# Patient Record
Sex: Male | Born: 1985 | Race: White | Hispanic: No | Marital: Single | Smoking: Current every day smoker
Health system: Southern US, Community
[De-identification: ages and names within clinical notes are randomized; demographics above are authoritative.]

## PROBLEM LIST (undated history)

## (undated) HISTORY — PX: BACK SURGERY: SHX140

---

## 2019-05-30 ENCOUNTER — Encounter (HOSPITAL_BASED_OUTPATIENT_CLINIC_OR_DEPARTMENT_OTHER): Payer: Self-pay | Admitting: *Deleted

## 2019-05-30 ENCOUNTER — Other Ambulatory Visit: Payer: Self-pay

## 2019-05-30 ENCOUNTER — Emergency Department (HOSPITAL_BASED_OUTPATIENT_CLINIC_OR_DEPARTMENT_OTHER)
Admission: EM | Admit: 2019-05-30 | Discharge: 2019-05-30 | Disposition: A | Discharge status: Home / Self Care | Payer: No Typology Code available for payment source | Attending: Emergency Medicine | Admitting: Emergency Medicine

## 2019-05-30 ENCOUNTER — Emergency Department (HOSPITAL_BASED_OUTPATIENT_CLINIC_OR_DEPARTMENT_OTHER): Payer: Self-pay | Attending: Emergency Medicine

## 2019-05-30 DIAGNOSIS — Y929 Unspecified place or not applicable: Secondary | ICD-10-CM | POA: Insufficient documentation

## 2019-05-30 DIAGNOSIS — S93402A Sprain of unspecified ligament of left ankle, initial encounter: Secondary | ICD-10-CM

## 2019-05-30 DIAGNOSIS — Z79899 Other long term (current) drug therapy: Secondary | ICD-10-CM | POA: Diagnosis not present

## 2019-05-30 DIAGNOSIS — F17200 Nicotine dependence, unspecified, uncomplicated: Secondary | ICD-10-CM | POA: Diagnosis not present

## 2019-05-30 DIAGNOSIS — Y990 Civilian activity done for income or pay: Secondary | ICD-10-CM | POA: Diagnosis not present

## 2019-05-30 DIAGNOSIS — Y9301 Activity, walking, marching and hiking: Secondary | ICD-10-CM | POA: Diagnosis not present

## 2019-05-30 DIAGNOSIS — W010XXA Fall on same level from slipping, tripping and stumbling without subsequent striking against object, initial encounter: Secondary | ICD-10-CM | POA: Insufficient documentation

## 2019-05-30 DIAGNOSIS — S99912A Unspecified injury of left ankle, initial encounter: Secondary | ICD-10-CM | POA: Diagnosis present

## 2019-05-30 NOTE — ED Notes (Signed)
Twisted left ankle yesterday  Increased pain w flexion on foot  Ice applied

## 2019-05-30 NOTE — ED Provider Notes (Addendum)
[facility] EMERGENCY DEPARTMENT Provider Note   CSN: [id number] Arrival date & time: 05/30/19  1603     History   Chief Complaint Chief Complaint  Patient presents with  . Ankle Injury    HPI Steven Bailey is a 33 y.o. male.     Patients with acute onset of left ankle pain and swelling beginning yesterday when he twisted his left ankle at work.  Patient states that he tripped over a hose.  States that he is able to walk flat-footed but has worsening pain with any bending of his ankle.  No knee or hip pain.  Over-the-counter medications and ankle brace applied prior to arrival.  No numbness or tingling.     History reviewed. No pertinent past medical history.  There are no active problems to display for this patient.   Past Surgical History:  Procedure Laterality Date  . BACK SURGERY          Home Medications    Prior to Admission medications   Medication Sig Start Date End Date Taking? Authorizing Provider  DULoxetine (CYMBALTA) 30 MG capsule Take 30 mg by mouth daily.   Yes [provider]  GABAPENTIN PO Take by mouth.   Yes [provider]  gemfibrozil (LOPID) 600 MG tablet Take 600 mg by mouth 2 (two) times daily before a meal.   Yes [provider]  methocarbamol (ROBAXIN) 500 MG tablet Take 500 mg by mouth 4 (four) times daily.   Yes [provider]  pregabalin (LYRICA) 25 MG capsule Take 25 mg by mouth 2 (two) times daily.   Yes [provider]    Family History No family history on file.  Social History Social History   Tobacco Use  . Smoking status: Current Every Day Smoker  . Smokeless tobacco: Never Used  Substance Use Topics  . Alcohol use: Not Currently  . Drug use: Never     Allergies   Patient has no known allergies.   Review of Systems Review of Systems  Constitutional: Negative for activity change.  Musculoskeletal: Positive for arthralgias and joint swelling. Negative for  back pain, gait problem and neck pain.  Skin: Negative for wound.  Neurological: Negative for weakness and numbness.     Physical Exam Updated Vital Signs BP 123/86   Pulse 64   Temp 99.6 F (37.6 C) (Oral)   Resp 16   Ht 5\' 5"  (1.651 m)   Wt 90.7 kg   SpO2 99%   BMI 33.28 kg/m   Physical Exam Vitals signs and nursing note reviewed.  Constitutional:      Appearance: He is well-developed.  HENT:     Head: Normocephalic and atraumatic.  Eyes:     Conjunctiva/sclera: Conjunctivae normal.  Neck:     Musculoskeletal: Normal range of motion and neck supple.  Cardiovascular:     Pulses: Normal pulses. No decreased pulses.  Musculoskeletal:        General: Swelling and tenderness present.     Right ankle: He exhibits decreased range of motion and swelling. Tenderness. Lateral malleolus and medial malleolus tenderness found. No head of 5th metatarsal and no proximal fibula tenderness found.       Feet:  Skin:    General: Skin is warm and dry.  Neurological:     Mental Status: He is alert.     Sensory: No sensory deficit.     Comments: Motor, sensation, and vascular distal to the injury is fully intact.  ED Treatments / Results  Labs (all labs ordered are listed, but only abnormal results are displayed) Labs Reviewed - No data to display  EKG None  Radiology Dg Ankle Complete Left  Result Date: 05/30/2019 CLINICAL DATA:  Left ankle pain after injury yesterday. EXAM: LEFT ANKLE COMPLETE - 3+ VIEW COMPARISON:  None. FINDINGS: There is no evidence of fracture, dislocation, or joint effusion. There is no evidence of arthropathy or other focal bone abnormality. Soft tissues are unremarkable. IMPRESSION: Negative. Electronically Signed   By: Marijo Conception M.D.   On: 05/30/2019 16:49    Procedures Procedures (including critical care time)  Medications Ordered in ED Medications - No data to display   Initial Impression / Assessment and Plan / ED Course  I  have reviewed the triage vital signs and the nursing notes.  Pertinent labs & imaging results that were available during my care of the patient were reviewed by me and considered in my medical decision making (see chart for details).        Patient seen and examined.  Discussed rice protocol.  Informed of x-ray results.  Vital signs reviewed and are as follows: BP 123/86   Pulse 64   Temp 99.6 F (37.6 C) (Oral)   Resp 16   Ht 5\' 5"  (1.651 m)   Wt 90.7 kg   SpO2 99%   BMI 33.28 kg/m   Sports medicine follow-up given in 1 week if not improved.  Patient states he is able to rest over the weekend.  Final Clinical Impressions(s) / ED Diagnoses   Final diagnoses:  Sprain of left ankle, unspecified ligament, initial encounter   Patient with left ankle sprain.  X-rays negative.  Conservative measures indicated with follow-up as needed.  Foot is neurovascularly intact.  ED Discharge Orders    None       Carlisle Cater, PA-C 05/30/19 1722    Carlisle Cater, PA-C 05/30/19 1723    Fredia Sorrow, MD 06/08/19 [phone]

## 2019-05-30 NOTE — ED Triage Notes (Signed)
Injury to his left ankle. He twisted his ankle yesterday. UDS not required.

## 2019-05-30 NOTE — Discharge Instructions (Signed)
Please read and follow all provided instructions.  Your diagnoses today include:  1. Sprain of left ankle, unspecified ligament, initial encounter    Tests performed today include:  An x-ray of your ankle - does NOT show any broken bones  Vital signs. See below for your results today.   Medications prescribed:  Please use over-the-counter NSAID medications (ibuprofen, naproxen) as directed on the packaging for pain.   Take any prescribed medications only as directed.  Home care instructions:   Follow any educational materials contained in this packet  Follow R.I.C.E. Protocol:  R - rest your injury   I  - use ice on injury without applying directly to skin  C - compress injury with bandage or splint  E - elevate the injury as much as possible  Follow-up instructions: Please follow-up with your primary care provider or the provided orthopedic (bone specialist) if you continue to have significant pain or trouble walking in 1 week. In this case you may have a severe sprain that requires further care.   Return instructions:   Please return if your toes are numb or tingling, appear gray or blue, or you have severe pain (also elevate leg and loosen splint or wrap)  Please return to the Emergency Department if you experience worsening symptoms.   Please return if you have any other emergent concerns.  Additional Information:  Your vital signs today were: BP 123/86    Pulse 64    Temp 99.6 F (37.6 C) (Oral)    Resp 16    Ht 5\' 5"  (1.651 m)    Wt 90.7 kg    SpO2 99%    BMI 33.28 kg/m  If your blood pressure (BP) was elevated above 135/85 this visit, please have this repeated by your doctor within one month. --------------

## 2020-12-09 IMAGING — DX DG ANKLE COMPLETE 3+V*L*
3 series · 3 of 3 positions shown · non-contrast
Comparison: None.

CLINICAL DATA: Left ankle pain after injury yesterday.

EXAM:
LEFT ANKLE COMPLETE - 3+ VIEW

[ankle ap]
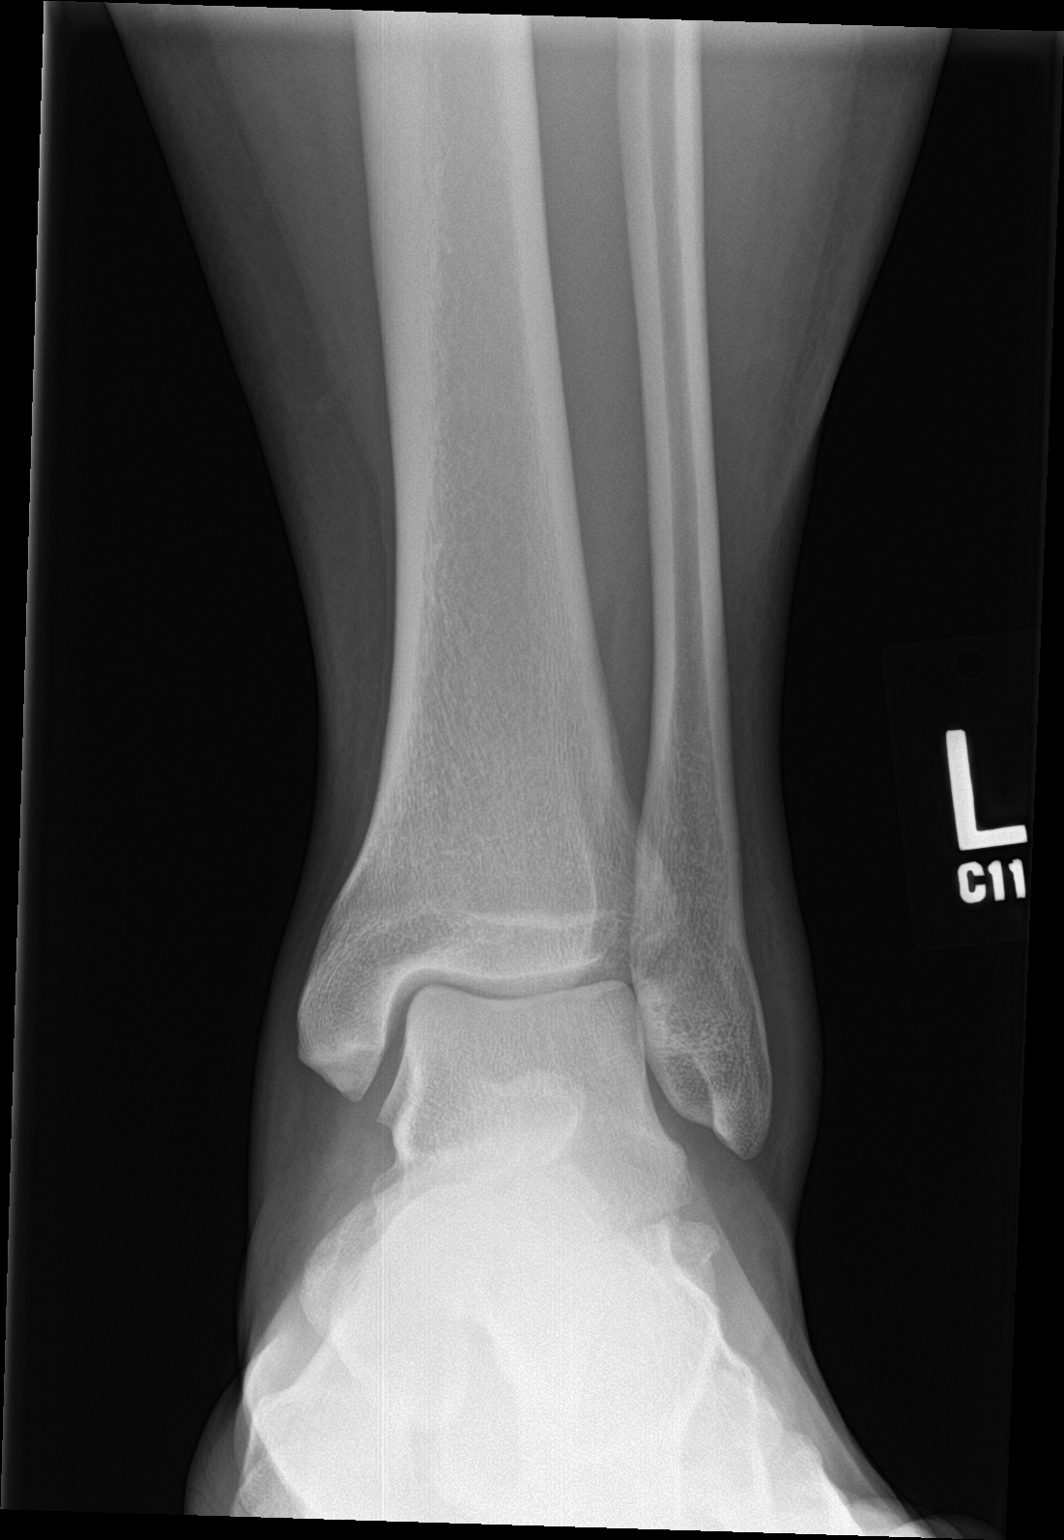

[ankle obl]
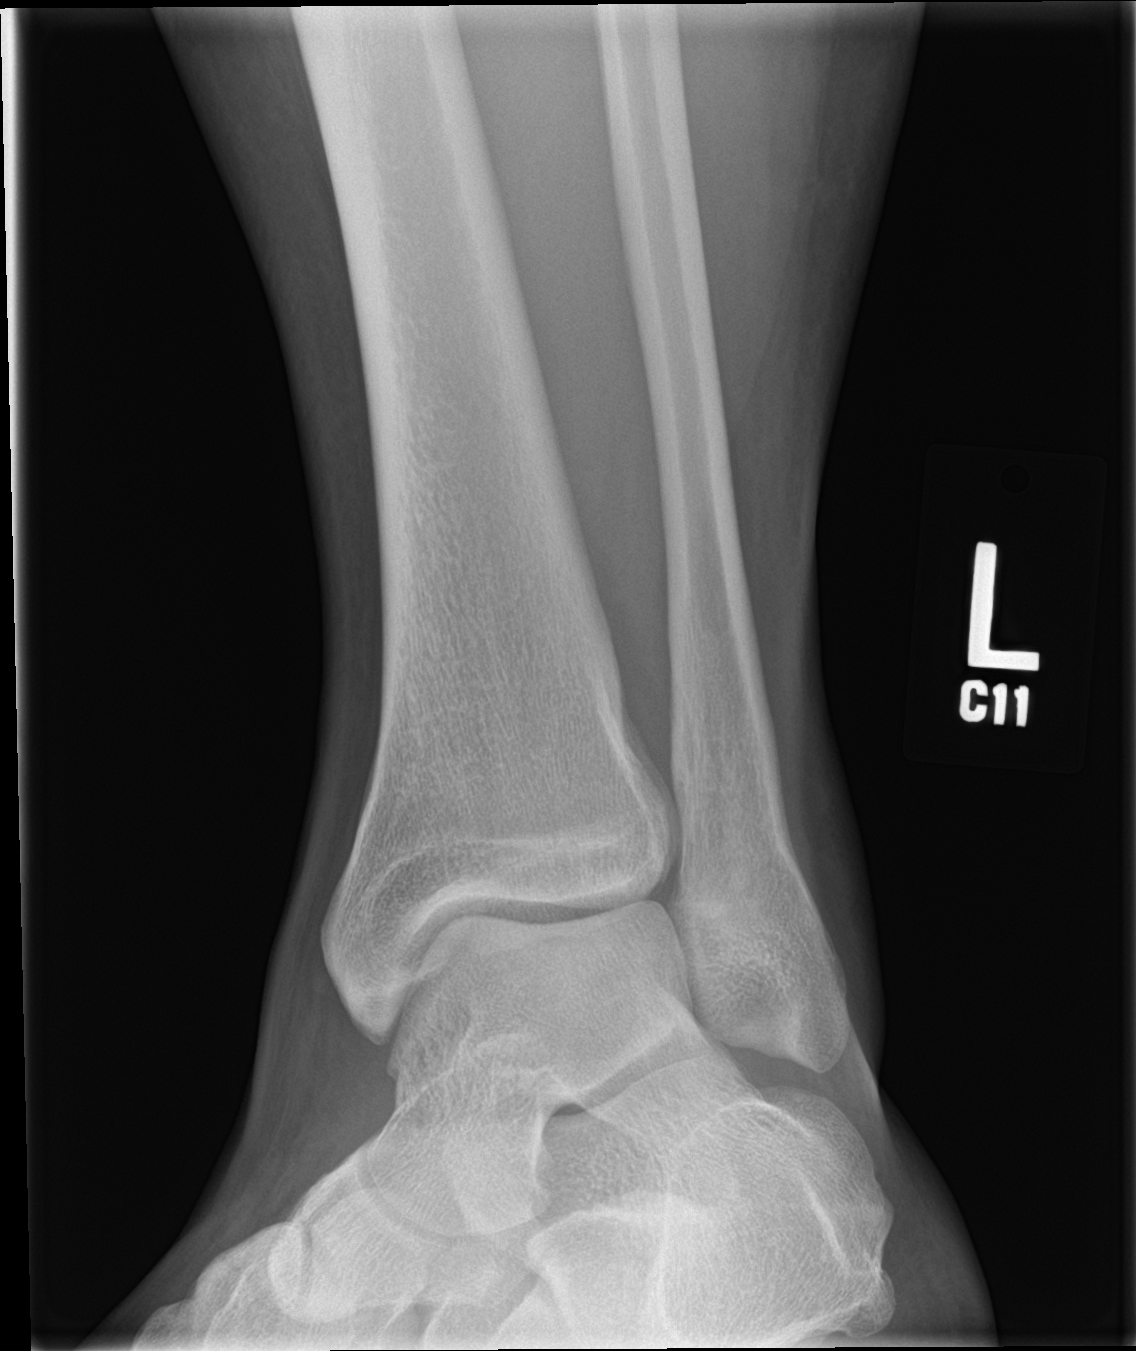

[ankle lat]
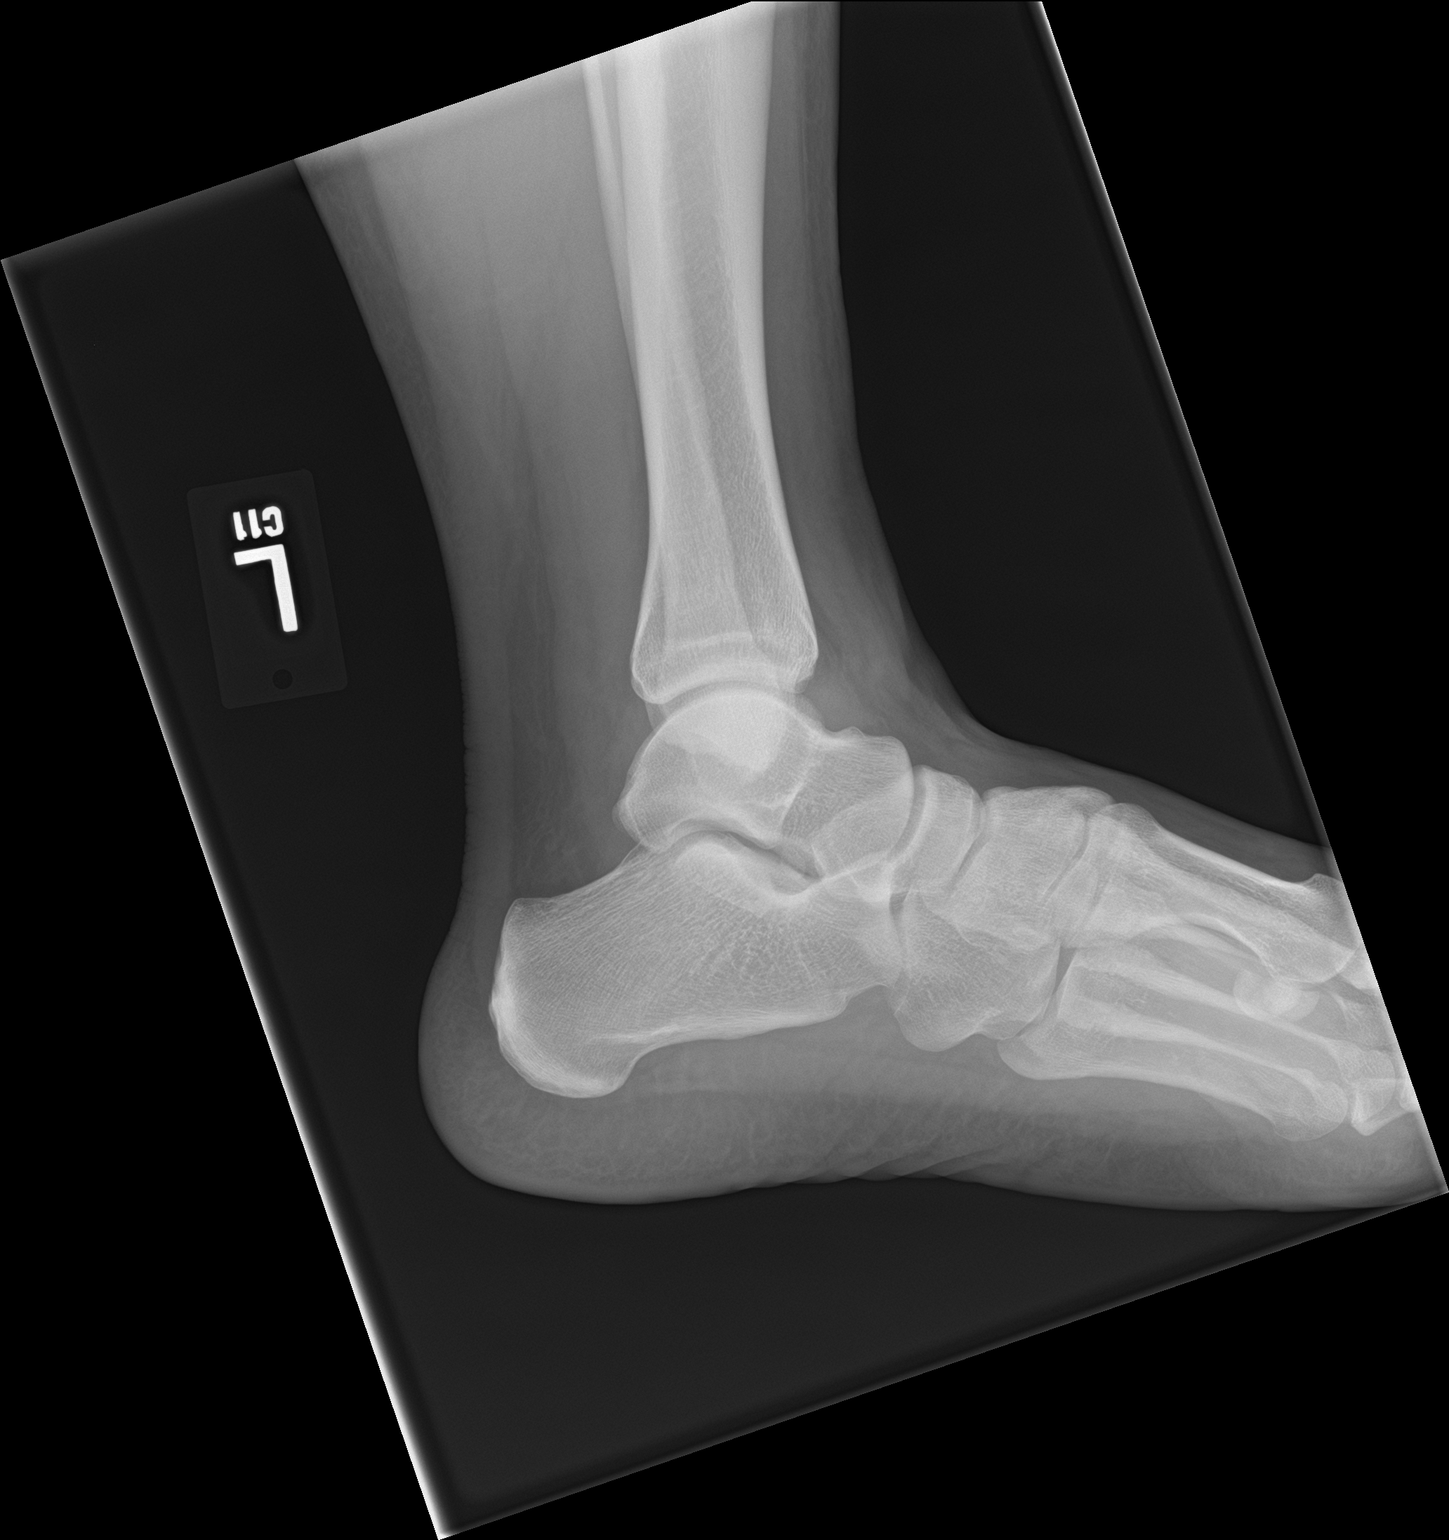

[3 of 3 positions shown; findings below may reference images not displayed]

FINDINGS: There is no evidence of fracture, dislocation, or joint effusion.
There is no evidence of arthropathy or other focal bone abnormality.
Soft tissues are unremarkable.
IMPRESSION: Negative.
